# Patient Record
Sex: Male | Born: 1975 | Hispanic: Yes | Marital: Married | Smoking: Never smoker
Health system: Southern US, Community
[De-identification: ages and names within clinical notes are randomized; demographics above are authoritative.]

## PROBLEM LIST (undated history)

## (undated) DIAGNOSIS — K8020 Calculus of gallbladder without cholecystitis without obstruction: Secondary | ICD-10-CM

## (undated) HISTORY — DX: Calculus of gallbladder without cholecystitis without obstruction: K80.20

## (undated) HISTORY — PX: CHOLECYSTECTOMY: SHX55

---

## 2010-05-03 ENCOUNTER — Emergency Department (HOSPITAL_COMMUNITY)
Admission: EM | Admit: 2010-05-03 | Discharge: 2010-05-04 | Disposition: A | Discharge status: Home / Self Care | Payer: 59 | Attending: Emergency Medicine | Admitting: Emergency Medicine

## 2010-05-03 DIAGNOSIS — R197 Diarrhea, unspecified: Secondary | ICD-10-CM | POA: Insufficient documentation

## 2010-05-04 LAB — CBC
HCT: 42.8 % (ref 39.0–52.0)
RBC: 4.96 MIL/uL (ref 4.22–5.81)
RDW: 13.5 % (ref 11.5–15.5)
WBC: 7.9 10*3/uL (ref 4.0–10.5)

## 2010-05-04 LAB — BASIC METABOLIC PANEL
Chloride: 105 mEq/L (ref 96–112)
GFR calc non Af Amer: >60 mL/min (ref >60)
Glucose, Bld: 106 mg/dL — ABNORMAL HIGH (ref 70–99)
Potassium: 4.1 mEq/L (ref 3.5–5.1)
Sodium: 142 mEq/L (ref 135–145)

## 2010-05-04 LAB — DIFFERENTIAL
Basophils Absolute: 0 10*3/uL (ref 0.0–0.1)
Eosinophils Relative: 2 % (ref 0–5)
Lymphocytes Relative: 20 % (ref 12–46)
Lymphs Abs: 1.5 10*3/uL (ref 0.7–4.0)
Neutro Abs: 5.6 10*3/uL (ref 1.7–7.7)
Neutrophils Relative %: 71 % (ref 43–77)

## 2010-05-04 LAB — OCCULT BLOOD, POC DEVICE: Fecal Occult Bld: POSITIVE

## 2018-09-25 ENCOUNTER — Other Ambulatory Visit: Payer: Self-pay

## 2018-09-25 DIAGNOSIS — Z20822 Contact with and (suspected) exposure to covid-19: Secondary | ICD-10-CM

## 2018-09-27 LAB — NOVEL CORONAVIRUS, NAA: SARS-CoV-2, NAA: NOT DETECTED

## 2018-09-29 ENCOUNTER — Other Ambulatory Visit: Payer: Self-pay

## 2018-09-29 DIAGNOSIS — Z20822 Contact with and (suspected) exposure to covid-19: Secondary | ICD-10-CM

## 2018-09-30 LAB — NOVEL CORONAVIRUS, NAA: SARS-CoV-2, NAA: NOT DETECTED

## 2020-04-07 ENCOUNTER — Telehealth: Payer: Self-pay | Admitting: Family

## 2020-04-07 DIAGNOSIS — R6889 Other general symptoms and signs: Secondary | ICD-10-CM

## 2020-04-07 DIAGNOSIS — Z20828 Contact with and (suspected) exposure to other viral communicable diseases: Secondary | ICD-10-CM

## 2020-04-07 MED ORDER — OSELTAMIVIR PHOSPHATE 75 MG PO CAPS: 75.0000 mg | ORAL_CAPSULE | Freq: Two times a day (BID) | ORAL | 0 refills | Status: DC

## 2020-04-07 NOTE — Progress Notes (Signed)
E visit for Flu like symptoms   We are sorry that you are not feeling well.  Here is how we plan to help! Based on what you have shared with me it looks like you may have possible exposure to a virus that causes influenza.  Influenza or "the flu" is   an infection caused by a respiratory virus. The flu virus is highly contagious and persons who did not receive their yearly flu vaccination may "catch" the flu from close contact.  We have anti-viral medications to treat the viruses that cause this infection. They are not a "cure" and only shorten the course of the infection. These prescriptions are most effective when they are given within the first 2 days of "flu" symptoms. Antiviral medication are indicated if you have a high risk of complications from the flu. You should  also consider an antiviral medication if you are in close contact with someone who is at risk. These medications can help patients avoid complications from the flu  but have side effects that you should know. Possible side effects from Tamiflu or oseltamivir include nausea, vomiting, diarrhea, dizziness, headaches, eye redness, sleep problems or other respiratory symptoms. You should not take Tamiflu if you have an allergy to oseltamivir or any to the ingredients in Tamiflu.  Based upon your symptoms and potential risk factors I have prescribed Oseltamivir (Tamiflu).  It has been sent to your designated pharmacy.  You will take one 75 mg capsule orally twice a day for the next 5 days.  ANYONE WHO HAS FLU SYMPTOMS SHOULD: . Stay home. The flu is highly contagious and going out or to work exposes others! . Be sure to drink plenty of fluids. Water is fine as well as fruit juices, sodas and electrolyte beverages. You may want to stay away from caffeine or alcohol. If you are nauseated, try taking small sips of liquids. How do you know if you are getting enough fluid? Your urine should be a pale yellow or almost colorless. . Get  rest. . Taking a steamy shower or using a humidifier may help nasal congestion and ease sore throat pain. Using a saline nasal spray works much the same way. . Cough drops, hard candies and sore throat lozenges may ease your cough. . Line up a caregiver. Have someone check on you regularly.   GET HELP RIGHT AWAY IF: . You cannot keep down liquids or your medications. . You become short of breath . Your fell like you are going to pass out or loose consciousness. . Your symptoms persist after you have completed your treatment plan MAKE SURE YOU   Understand these instructions.  Will watch your condition.  Will get help right away if you are not doing well or get worse.  Your e-visit answers were reviewed by a board certified advanced clinical practitioner to complete your personal care plan.  Depending on the condition, your plan could have included both over the counter or prescription medications.  If there is a problem please reply  once you have received a response from your provider.  Your safety is important to us.  If you have drug allergies check your prescription carefully.    You can use MyChart to ask questions about today's visit, request a non-urgent call back, or ask for a work or school excuse for 24 hours related to this e-Visit. If it has been greater than 24 hours you will need to follow up with your provider, or enter a new   e-Visit to address those concerns.  You will get an e-mail in the next two days asking about your experience.  I hope that your e-visit has been valuable and will speed your recovery. Thank you for using e-visits.  Approximately 5 minutes was spent documenting and reviewing patient's chart.

## 2020-11-14 ENCOUNTER — Other Ambulatory Visit: Payer: Self-pay

## 2020-11-14 ENCOUNTER — Ambulatory Visit: Payer: 59 | Admitting: Nurse Practitioner

## 2020-11-14 ENCOUNTER — Encounter: Payer: Self-pay | Admitting: Nurse Practitioner

## 2020-11-14 VITALS — BP 126/84 | HR 83 | Temp 98.3°F | Ht 72.0 in | Wt 311.0 lb

## 2020-11-14 DIAGNOSIS — Z7689 Persons encountering health services in other specified circumstances: Secondary | ICD-10-CM

## 2020-11-14 DIAGNOSIS — R1011 Right upper quadrant pain: Secondary | ICD-10-CM | POA: Insufficient documentation

## 2020-11-14 DIAGNOSIS — Z23 Encounter for immunization: Secondary | ICD-10-CM | POA: Diagnosis not present

## 2020-11-14 DIAGNOSIS — Z0000 Encounter for general adult medical examination without abnormal findings: Secondary | ICD-10-CM

## 2020-11-14 DIAGNOSIS — Z6841 Body Mass Index (BMI) 40.0 and over, adult: Secondary | ICD-10-CM

## 2020-11-14 NOTE — Progress Notes (Signed)
New Patient Office Visit  Subjective:  Patient ID: Benjamin Mann, male    DOB: 11/06/1975  Age: 45 y.o. MRN: [id number]  CC:  Chief Complaint  Patient presents with   New Patient (Initial Visit)    HPI Benjamin Mann presents to establish new primary care provider. States that in June, he had episode of right upper quadrant abdominal pain. This was accompanied by nausea and vomiting. vomitus was yellow bile like fluid. He did not have a fever. Thinks he ate wings and pizza prior to episode, but is not certain. He states that episode lasted for a few hours then gradually subsided on it's own. He had similar episode in January 2022. He was not seen in ER as symptoms did resolve spontaneously.   He is due to have check of routine, fasting labs.  Would like to get flu shot today. Needs to have annual wellness visit .  History reviewed. No pertinent past medical history.  History reviewed. No pertinent surgical history.  Family History  Problem Relation Age of Onset   Heart attack Mother    Depression Mother    High Cholesterol Mother    High blood pressure Mother    High blood pressure Father    Diabetes Father    Stomach cancer Maternal Grandfather     Social History   Socioeconomic History   Marital status: Married    Spouse name: Not on file   Number of children: Not on file   Years of education: Not on file   Highest education level: Not on file  Occupational History   Not on file  Tobacco Use   Smoking status: Never   Smokeless tobacco: Never  Vaping Use   Vaping Use: Not on file  Substance and Sexual Activity   Alcohol use: Never   Drug use: Never   Sexual activity: Yes  Other Topics Concern   Not on file  Social History Narrative   Not on file   Social Determinants of Health   Financial Resource Strain: Not on file  Food Insecurity: Not on file  Transportation Needs: Not on file  Physical Activity: Not on file  Stress: Not on file  Social  Connections: Not on file  Intimate Partner Violence: Not on file    ROS Review of Systems  Constitutional:  Negative for activity change, chills, fatigue and fever.  HENT:  Negative for congestion, postnasal drip, rhinorrhea, sinus pressure, sinus pain, sneezing and sore throat.   Eyes: Negative.   Respiratory:  Negative for cough, shortness of breath and wheezing.   Cardiovascular:  Negative for chest pain and palpitations.  Gastrointestinal:  Negative for constipation, diarrhea, nausea and vomiting.       Two episodes of sudden and sharp right upper quadrant abdominal pain since the beginning of 2022. Both episodes included nausea and vomiting bile. Both episodes resolved on their own.   Endocrine: Negative for cold intolerance, heat intolerance, polydipsia and polyuria.  Genitourinary:  Negative for dysuria, frequency and urgency.  Musculoskeletal:  Negative for back pain and myalgias.  Skin:  Negative for rash.  Allergic/Immunologic: Negative for environmental allergies.  Neurological:  Negative for dizziness, weakness and headaches.  Psychiatric/Behavioral:  The patient is not nervous/anxious.    Objective:   Today's Vitals   11/14/20 0954  BP: 126/84  Pulse: 83  Temp: 98.3 F (36.8 C)  SpO2: 96%  Weight: (!) 311 lb (141.1 kg)  Height: 6' (1.829 m)   Body mass index is  42.18 kg/m.   Physical Exam Vitals and nursing note reviewed.  Constitutional:      Appearance: Normal appearance. He is well-developed. He is obese.  HENT:     Head: Normocephalic and atraumatic.  Eyes:     Pupils: Pupils are equal, round, and reactive to light.  Cardiovascular:     Rate and Rhythm: Normal rate and regular rhythm.     Pulses: Normal pulses.     Heart sounds: Normal heart sounds.  Pulmonary:     Effort: Pulmonary effort is normal.     Breath sounds: Normal breath sounds.  Abdominal:     General: Bowel sounds are normal.     Palpations: Abdomen is soft.     Tenderness: There is  no abdominal tenderness.  Musculoskeletal:        General: Normal range of motion.     Cervical back: Normal range of motion and neck supple.  Lymphadenopathy:     Cervical: No cervical adenopathy.  Skin:    General: Skin is warm and dry.     Capillary Refill: Capillary refill takes less than 2 seconds.  Neurological:     General: No focal deficit present.     Mental Status: He is alert and oriented to person, place, and time.  Psychiatric:        Mood and Affect: Mood normal.        Behavior: Behavior normal.        Thought Content: Thought content normal.        Judgment: Judgment normal.    Assessment & Plan:  1. Encounter to establish care Appointment today to establish new primary care provider    2. Right upper quadrant abdominal pain Concern for gallstones or cholecystitis causing episodes of right upper quadrant abdominal pain with vomiting. Will get ultrasound of the RUQ of abdomen for further evaluation. Refer as indicated.  - US Abdomen Limited RUQ (LIVER/GB); Future  3. Body mass index (BMI) of 40.1-44.9 in adult [facility]) Encourage patient to limit calorie intake to 2000 cal/day or less.  He should consume a low cholesterol, low-fat diet.  Patient incorporate exercise into his daily routine.   4. Healthcare maintenance Routine, fasting labs drawn during today's visit  - Hemoglobin A1c; Future - CBC; Future - Comp Met (CMET); Future - TSH; Future - Lipid panel; Future - TSH - Comp Met (CMET) - CBC - Hemoglobin A1c - Lipid panel  5. Need for influenza vaccination Flu vaccie administered during today's visit  - Flu Vaccine QUAD 6+ mos PF IM (Fluarix Quad PF)    Problem List Items Addressed This Visit       Other   Right upper quadrant abdominal pain   Relevant Orders   US Abdomen Limited RUQ (LIVER/GB)   Body mass index (BMI) of 40.1-44.9 in adult [facility])   Other Visit Diagnoses     Encounter to establish care    -  Primary   Healthcare maintenance        Relevant Orders   Hemoglobin A1c   CBC   Comp Met (CMET)   TSH   Lipid panel   Need for influenza vaccination       Relevant Orders   Flu Vaccine QUAD 6+ mos PF IM (Fluarix Quad PF) (Completed)       Outpatient Encounter Medications as of 11/14/2020  Medication Sig   oseltamivir (TAMIFLU) 75 MG capsule Take 1 capsule (75 mg total) by mouth 2 (two) times daily.  No facility-administered encounter medications on file as of 11/14/2020.    Follow-up: Return in about 6 weeks (around 12/26/2020) for health maintenance exam.   Ronnell Freshwater, NP

## 2020-11-14 NOTE — Patient Instructions (Signed)
Fat and Cholesterol Restricted Eating Plan Getting too much fat and cholesterol in your diet may cause health problems. Choosing the right foods helps keep your fat and cholesterol at normal levels. This can keep you from getting certain diseases. Your doctor may recommend an eating plan that includes: Total fat: ______% or less of total calories a day. This is ______g of fat a day. Saturated fat: ______% or less of total calories a day. This is ______g of saturated fat a day. Cholesterol: less than _________mg a day. Fiber: ______g a day. What are tips for following this plan? General tips Work with your doctor to lose weight if you need to. Avoid: Foods with added sugar. Fried foods. Foods with trans fat or partially hydrogenated oils. This includes some margarines and baked goods. If you drink alcohol: Limit how much you have to: 0-1 drink a day for women who are not pregnant. 0-2 drinks a day for men. Know how much alcohol is in a drink. In the [place] one drink equals one 12 oz bottle of beer (355 mL), one 5 oz glass of wine (148 mL), or one 1 oz glass of hard liquor (44 mL). Reading food labels Check food labels for: Trans fats. Partially hydrogenated oils. Saturated fat (g) in each serving. Cholesterol (mg) in each serving. Fiber (g) in each serving. Choose foods with healthy fats, such as: Monounsaturated fats and polyunsaturated fats. These include olive and canola oil, flaxseeds, walnuts, almonds, and seeds. Omega-3 fats. These are found in certain fish, flaxseed oil, and ground flaxseeds. Choose grain products that have whole grains. Look for the word "whole" as the first word in the ingredient list. Cooking Cook foods using low-fat methods. These include baking, boiling, grilling, and broiling. Eat more home-cooked foods. Eat at restaurants and buffets less often. Eat less fast food. Avoid cooking using saturated fats, such as butter, cream, palm oil, palm kernel oil, and  coconut oil. Meal planning  At meals, divide your plate into four equal parts: Fill one-half of your plate with vegetables, green salads, and fruit. Fill one-fourth of your plate with whole grains. Fill one-fourth of your plate with low-fat (lean) protein foods. Eat fish that is high in omega-3 fats at least two times a week. This includes mackerel, tuna, sardines, and salmon. Eat foods that are high in fiber, such as whole grains, beans, apples, pears, berries, broccoli, carrots, peas, and barley. What foods should I eat? Fruits All fresh, canned (in natural juice), or frozen fruits. Vegetables Fresh or frozen vegetables (raw, steamed, roasted, or grilled). Green salads. Grains Whole grains, such as whole wheat or whole grain breads, crackers, cereals, and pasta. Unsweetened oatmeal, bulgur, barley, quinoa, or brown rice. Corn or whole wheat flour tortillas. Meats and other protein foods Ground beef (85% or leaner), grass-fed beef, or beef trimmed of fat. Skinless chicken or turkey. Ground chicken or turkey. Pork trimmed of fat. All fish and seafood. Egg whites. Dried beans, peas, or lentils. Unsalted nuts or seeds. Unsalted canned beans. Nut butters without added sugar or oil. Dairy Low-fat or nonfat dairy products, such as skim or 1% milk, 2% or reduced-fat cheeses, low-fat and fat-free ricotta or cottage cheese, or plain low-fat and nonfat yogurt. Fats and oils Tub margarine without trans fats. Light or reduced-fat mayonnaise and salad dressings. Avocado. Olive, canola, sesame, or safflower oils. The items listed above may not be a complete list of foods and beverages you can eat. Contact a dietitian for more information. What foods   should I avoid? Fruits Canned fruit in heavy syrup. Fruit in cream or butter sauce. Fried fruit. Vegetables Vegetables cooked in cheese, cream, or butter sauce. Fried vegetables. Grains White bread. White pasta. White rice. Cornbread. Bagels, pastries,  and croissants. Crackers and snack foods that contain trans fat and hydrogenated oils. Meats and other protein foods Fatty cuts of meat. Ribs, chicken wings, bacon, sausage, bologna, salami, chitterlings, fatback, hot dogs, bratwurst, and packaged lunch meats. Liver and organ meats. Whole eggs and egg yolks. Chicken and turkey with skin. Fried meat. Dairy Whole or 2% milk, cream, half-and-half, and cream cheese. Whole milk cheeses. Whole-fat or sweetened yogurt. Full-fat cheeses. Nondairy creamers and whipped toppings. Processed cheese, cheese spreads, and cheese curds. Fats and oils Butter, stick margarine, lard, shortening, ghee, or bacon fat. Coconut, palm kernel, and palm oils. Beverages Alcohol. Sugar-sweetened drinks such as sodas, lemonade, and fruit drinks. Sweets and desserts Corn syrup, sugars, honey, and molasses. Candy. Jam and jelly. Syrup. Sweetened cereals. Cookies, pies, cakes, donuts, muffins, and ice cream. The items listed above may not be a complete list of foods and beverages you should avoid. Contact a dietitian for more information. Summary Choosing the right foods helps keep your fat and cholesterol at normal levels. This can keep you from getting certain diseases. At meals, fill one-half of your plate with vegetables, green salads, and fruits. Eat high fiber foods, like whole grains, beans, apples, pears, berries, carrots, peas, and barley. Limit added sugar, saturated fats, alcohol, and fried foods. This information is not intended to replace advice given to you by your health care provider. Make sure you discuss any questions you have with your health care provider. Document Revised: 05/02/2020 Document Reviewed: 05/02/2020 Elsevier Patient Education  2022 Elsevier Inc.  

## 2020-11-15 LAB — COMPREHENSIVE METABOLIC PANEL
ALT: 18 IU/L (ref 0–44)
AST: 15 IU/L (ref 0–40)
Albumin/Globulin Ratio: 2 (ref 1.2–2.2)
Albumin: 4.7 g/dL (ref 4.0–5.0)
Alkaline Phosphatase: 94 IU/L (ref 44–121)
BUN/Creatinine Ratio: 15 (ref 9–20)
BUN: 13 mg/dL (ref 6–24)
Bilirubin Total: 0.6 mg/dL (ref 0.0–1.2)
CO2: 24 mmol/L (ref 20–29)
Calcium: 9.5 mg/dL (ref 8.7–10.2)
Chloride: 103 mmol/L (ref 96–106)
Creatinine, Ser: 0.86 mg/dL (ref 0.76–1.27)
Globulin, Total: 2.3 g/dL (ref 1.5–4.5)
Glucose: 88 mg/dL (ref 70–99)
Potassium: 4.3 mmol/L (ref 3.5–5.2)
Sodium: 141 mmol/L (ref 134–144)
Total Protein: 7 g/dL (ref 6.0–8.5)
eGFR: 110 mL/min/{1.73_m2} (ref >59)

## 2020-11-15 LAB — CBC
Hematocrit: 43.2 % (ref 37.5–51.0)
Hemoglobin: 14.2 g/dL (ref 13.0–17.7)
MCH: 28.7 pg (ref 26.6–33.0)
MCHC: 32.9 g/dL (ref 31.5–35.7)
MCV: 87 fL (ref 79–97)
Platelets: 276 10*3/uL (ref 150–450)
RBC: 4.95 x10E6/uL (ref 4.14–5.80)
RDW: 13.8 % (ref 11.6–15.4)
WBC: 6.8 10*3/uL (ref 3.4–10.8)

## 2020-11-15 LAB — LIPID PANEL
Chol/HDL Ratio: 4.8 ratio (ref 0.0–5.0)
Cholesterol, Total: 211 mg/dL — ABNORMAL HIGH (ref 100–199)
HDL: 44 mg/dL (ref >39)
LDL Chol Calc (NIH): 148 mg/dL — ABNORMAL HIGH (ref 0–99)
Triglycerides: 104 mg/dL (ref 0–149)
VLDL Cholesterol Cal: 19 mg/dL (ref 5–40)

## 2020-11-15 LAB — HEMOGLOBIN A1C
Est. average glucose Bld gHb Est-mCnc: 114 mg/dL
Hgb A1c MFr Bld: 5.6 % (ref 4.8–5.6)

## 2020-11-15 LAB — TSH: TSH: 2.7 u[IU]/mL (ref 0.450–4.500)

## 2020-11-26 NOTE — Progress Notes (Signed)
LDL and total cholesterol mildly elevated. Other labs good. Waiting on abdominal ultrasound results.

## 2020-12-05 ENCOUNTER — Other Ambulatory Visit: Payer: 59

## 2021-01-09 ENCOUNTER — Ambulatory Visit
Admission: RE | Admit: 2021-01-09 | Discharge: 2021-01-09 | Disposition: A | Discharge status: Home / Self Care | Payer: 59 | Source: Ambulatory Visit | Attending: Nurse Practitioner | Admitting: Nurse Practitioner

## 2021-01-09 ENCOUNTER — Other Ambulatory Visit: Payer: Self-pay

## 2021-01-09 DIAGNOSIS — R1011 Right upper quadrant pain: Secondary | ICD-10-CM

## 2021-01-12 ENCOUNTER — Other Ambulatory Visit: Payer: Self-pay | Admitting: Nurse Practitioner

## 2021-01-12 ENCOUNTER — Telehealth: Payer: Self-pay | Admitting: Nurse Practitioner

## 2021-01-12 DIAGNOSIS — R1011 Right upper quadrant pain: Secondary | ICD-10-CM

## 2021-01-12 DIAGNOSIS — D376 Neoplasm of uncertain behavior of liver, gallbladder and bile ducts: Secondary | ICD-10-CM

## 2021-01-12 DIAGNOSIS — K8020 Calculus of gallbladder without cholecystitis without obstruction: Secondary | ICD-10-CM

## 2021-01-12 NOTE — Progress Notes (Signed)
Urgent referral to GI made. 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 Has intermittent, but severe right upper quadrant pain.

## 2021-01-12 NOTE — Telephone Encounter (Signed)
With this being a new diagnosis of a hypoechoic mass, it would be more appropriate for you as the provider to call to discuss this with him.

## 2021-01-12 NOTE — Telephone Encounter (Signed)
Yes. I have spoken to the patient and he is aware of the findings. He also knows that an urgent referral to GI has been made and that he will, most likely, see surgeon after that.

## 2021-01-12 NOTE — Telephone Encounter (Signed)
Please let the patient know that I have make and urgent referral to GI made. 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  Thanks so much.   -HB

## 2021-01-12 NOTE — Telephone Encounter (Signed)
[facility] called with results from [place] of Abdomen. Please review these results (they are in the chart). AS, CMA

## 2021-01-19 ENCOUNTER — Encounter: Payer: Self-pay | Admitting: Gastroenterology

## 2021-02-06 ENCOUNTER — Encounter: Payer: Self-pay | Admitting: Gastroenterology

## 2021-02-06 ENCOUNTER — Ambulatory Visit (INDEPENDENT_AMBULATORY_CARE_PROVIDER_SITE_OTHER): Payer: 59 | Admitting: Gastroenterology

## 2021-02-06 ENCOUNTER — Encounter: Payer: 59 | Admitting: Nurse Practitioner

## 2021-02-06 VITALS — BP 150/98 | HR 84 | Ht 72.75 in | Wt 326.0 lb

## 2021-02-06 DIAGNOSIS — K760 Fatty (change of) liver, not elsewhere classified: Secondary | ICD-10-CM

## 2021-02-06 DIAGNOSIS — K8020 Calculus of gallbladder without cholecystitis without obstruction: Secondary | ICD-10-CM

## 2021-02-06 DIAGNOSIS — R932 Abnormal findings on diagnostic imaging of liver and biliary tract: Secondary | ICD-10-CM

## 2021-02-06 NOTE — Patient Instructions (Signed)
If you are age 45 or older, your body mass index should be between 23-30. Your Body mass index is 43.31 kg/m. If this is out of the aforementioned range listed, please consider follow up with your Primary Care Provider.  If you are age 21 or younger, your body mass index should be between 19-25. Your Body mass index is 43.31 kg/m. If this is out of the aformentioned range listed, please consider follow up with your Primary Care Provider.   You will be contacted by [place] in the next 2 days to arrange a .........  The number on your caller ID will be [phone], please answer when they call.  If you have not heard from them in 2 days please call [phone] to schedule.     You have been scheduled for an MR a.......... on.......Marland KitchenYour appointment time is......... Please arrive to admitting (at main entrance of the hospital) 15 minutes prior to your appointment time for registration purposes. Please make certain not to have anything to eat or drink 6 hours prior to your test. In addition, if you have any metal in your body, have a pacemaker or defibrillator, please be sure to let your ordering physician know. This test typically takes 45 minutes to 1 hour to complete. Should you need to reschedule, please call [phone] to do so.   We will send a referral to [facility] and they will call you to schedule an appointment.    The [facility] providers would like to encourage you to use [facility] to communicate with providers for non-urgent requests or questions.  Due to long hold times on the telephone, sending your provider a message by [facility] may be a faster and more efficient way to get a response.  Please allow 48 business hours for a response.  Please remember that this is for non-urgent requests.   It was a pleasure to see you today!  Thank you for trusting me with your gastrointestinal care!    Scott E.Candis Schatz, MD

## 2021-02-06 NOTE — Progress Notes (Signed)
HPI : Benjamin Mann is a very pleasant 46 year old male with obesity, but no other chronic medical history.  He is referred to [place] by [name] NP for further evaluation of incidentally noted liver lesion on right upper quadrant ultrasound.  The ultrasound was performed to evaluate episodic right upper quadrant pain.  He reports having 3 episodes of this pain total, the last one in September 2022.  He describes the episode as awakening from sleep at 2 AM with severe pain in the right upper quadrant.  The pain was 10 out of 10 in severity and was constant, lasting for hours.  He eventually vomited a large amount of yellowish bilious fluid, and this helped relieve his pain.  He had no fevers or chills.  The next day, he had some mild lingering pain in the right upper abdomen, but otherwise felt okay.  He has not had recurrence of this pain since then.   He denies frequent symptoms of right upper quadrant pain or nausea.  He does have occasional symptoms of heartburn described as a burning sensation in his upper abdomen or chest, which is improved with taking Tums.  He has regular bowel movements, without problems of constipation, diarrhea or blood in the stool. He underwent an ultrasound in January of this year which showed gallstones with no evidence of cholecystitis, but was also noted to have a 3 cm solid lesion in the right hepatic lobe.  Diffusely increased echogenicity of the liver parenchyma was noted.  The patient has no history of liver disease.  He has no family history of liver disease or liver cancer.  No family history of gallstones. He is obese, but has no other NAFLD risk factors.  He rarely drinks any alcohol.  He denies any history of regular alcohol use.   History reviewed. No pertinent past medical history.   History reviewed. No pertinent surgical history. Family History  Problem Relation Age of Onset   Heart attack Mother    Depression Mother    High Cholesterol Mother     High blood pressure Mother    High blood pressure Father    Diabetes Father    Stomach cancer Maternal Grandfather    Social History   Tobacco Use   Smoking status: Never   Smokeless tobacco: Never  Substance Use Topics   Alcohol use: Never   Drug use: Never   No current outpatient medications on file.   No current facility-administered medications for this visit.   No Known Allergies   Review of Systems: All systems reviewed and negative except where noted in HPI.    US Abdomen Limited RUQ (LIVER/GB)  Result Date: 01/09/2021 CLINICAL DATA:  Several episodes of right upper quadrant pain with bilious vomiting in the past year. EXAM: ULTRASOUND ABDOMEN LIMITED RIGHT UPPER QUADRANT COMPARISON:  None. FINDINGS: Gallbladder: Cholelithiasis. No gallbladder wall thickening or pericholecystic fluid. Sonographic Percell Miller sign is negative per technologist. Common bile duct: Diameter: 3 mm Liver: Diffuse increased echogenicity. 3.0 cm solid hypoechoic mass noted in the right liver lobe. Additional hypoechoic lesion in the posterior right hepatic lobe measuring 2.1 cm is consistent with a cyst. Portal vein is patent on color Doppler imaging with normal direction of blood flow towards the liver. Other: None. IMPRESSION: 1. 3.0 cm solid hypoechoic right liver mass or course further evaluation with contrast enhanced CT or MRI. 2. Mild cholelithiasis These results will be called to the ordering clinician or representative by the Radiologist Assistant, and communication documented  in the PACS or [employer]. Electronically Signed   By: Miachel Roux M.D.   On: 01/09/2021 13:27    Physical Exam: BP (!) 150/98    Pulse 84    Ht 6' 0.75" (1.848 m)    Wt (!) 326 lb (147.9 kg)    BMI 43.31 kg/m  Constitutional: Pleasant,well-developed, obese Caucasian male in no acute distress. HEENT: Normocephalic and atraumatic. Conjunctivae are normal. No scleral icterus.  Mallampati 3 Cardiovascular: Normal rate,  regular rhythm.  Pulmonary/chest: Effort normal and breath sounds normal. No wheezing, rales or rhonchi. Abdominal: Soft, nondistended, nontender.  Murphy's negative.  Bowel sounds active throughout. There are no masses palpable. No hepatomegaly. Extremities: no edema Neurological: Alert and oriented to person place and time. Skin: Skin is warm and dry. No rashes noted. Psychiatric: Normal mood and affect. Behavior is normal.  CBC    Component Value Date/Time   WBC 6.8 11/14/2020 1038   WBC 7.9 05/04/2010 0217   RBC 4.95 11/14/2020 1038   RBC 4.96 05/04/2010 0217   HGB 14.2 11/14/2020 1038   HCT 43.2 11/14/2020 1038   PLT 276 11/14/2020 1038   MCV 87 11/14/2020 1038   MCH 28.7 11/14/2020 1038   MCH 29.0 05/04/2010 0217   MCHC 32.9 11/14/2020 1038   MCHC 33.6 05/04/2010 0217   RDW 13.8 11/14/2020 1038   LYMPHSABS 1.5 05/04/2010 0217   MONOABS 0.6 05/04/2010 0217   EOSABS 0.2 05/04/2010 0217   BASOSABS 0.0 05/04/2010 0217    CMP     Component Value Date/Time   NA 141 11/14/2020 1038   K 4.3 11/14/2020 1038   CL 103 11/14/2020 1038   CO2 24 11/14/2020 1038   GLUCOSE 88 11/14/2020 1038   GLUCOSE 106 (H) 05/04/2010 0217   BUN 13 11/14/2020 1038   CREATININE 0.86 11/14/2020 1038   CALCIUM 9.5 11/14/2020 1038   PROT 7.0 11/14/2020 1038   ALBUMIN 4.7 11/14/2020 1038   AST 15 11/14/2020 1038   ALT 18 11/14/2020 1038   ALKPHOS 94 11/14/2020 1038   BILITOT 0.6 11/14/2020 1038   GFRNONAA >60 05/04/2010 0217   GFRAA  05/04/2010 0217    >60        The eGFR has been calculated using the MDRD equation. This calculation has not been validated in all clinical situations. eGFR's persistently <60 mL/min signify possible Chronic Kidney Disease.     ASSESSMENT AND PLAN: 46 year old male with 3 isolated episodes of severe right upper quadrant pain associated with nausea and vomiting, last episode in September, with ultrasound evidence of cholelithiasis.  This history seems  consistent with symptomatic cholelithiasis.  Although his episodes are infrequent, I do recommend he be evaluated by a surgeon to discuss cholecystectomy.  He was incidentally found to have a 3 cm solid-appearing hypoechoic mass.  He has no history of cirrhosis and has no evidence of cirrhosis on imaging or labs.  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  The nature of this 3 cm mass is unclear, need to further characterize with MRI with contrast.    Liver mass - MRI liver w/ contrast  Symptomatic cholelithiasis -Refer to general surgery to discuss cholecystectomy  Fatty liver - We will discuss further after MRI  Colon cancer screening - Patient will be due for screening colonoscopy after he turns 46 in December of this year  Masahiro Iglesia E. Candis Schatz, MD [facility]   CC:  Ronnell Freshwater, NP

## 2021-02-20 ENCOUNTER — Encounter: Payer: 59 | Admitting: Nurse Practitioner

## 2021-02-20 ENCOUNTER — Ambulatory Visit (HOSPITAL_COMMUNITY)
Admission: RE | Admit: 2021-02-20 | Discharge: 2021-02-20 | Disposition: A | Discharge status: Home / Self Care | Payer: 59 | Source: Ambulatory Visit | Attending: Gastroenterology | Admitting: Gastroenterology

## 2021-02-20 ENCOUNTER — Other Ambulatory Visit: Payer: Self-pay

## 2021-02-20 DIAGNOSIS — K8020 Calculus of gallbladder without cholecystitis without obstruction: Secondary | ICD-10-CM | POA: Insufficient documentation

## 2021-02-20 DIAGNOSIS — R932 Abnormal findings on diagnostic imaging of liver and biliary tract: Secondary | ICD-10-CM | POA: Insufficient documentation

## 2021-02-20 MED ORDER — GADOBUTROL 1 MMOL/ML IV SOLN
10.0000 mL | Freq: Once | INTRAVENOUS | Status: AC | PRN
  Administered 2021-02-20: 10 mL via INTRAVENOUS

## 2021-02-23 ENCOUNTER — Encounter: Payer: Self-pay | Admitting: Gastroenterology

## 2021-02-24 NOTE — Progress Notes (Signed)
Benjamin Mann,  Your MRI showed that the large lesion in your liver is a hemangioma.  This is good news.  Hemangiomas in the liver are very common and are completely benign.  They do not have the potential to turn into cancer.  They do not cause symptoms.  You also had a few cysts in the liver which are also common and are completely benign.  You do not require any follow up imaging or surveillance of these findings. I'm not sure why you have not heard anything from the surgeon's office.   Vaughan Basta,  Are you able to check the status of his referral to General Surgery and see if there is anything we can do to help him get seen?

## 2021-02-25 ENCOUNTER — Telehealth: Payer: Self-pay | Admitting: Gastroenterology

## 2021-02-25 NOTE — Telephone Encounter (Signed)
Patient calling to follow up on [facility] referral said he called them to schedule but they told him there is no referral.

## 2021-02-25 NOTE — Telephone Encounter (Signed)
Called [facility] and they stated patient is scheduled for 03/03/21 . I called patient and confirmed that he knew when his appointment was. Patient confirmed and had no questions at the end of call.

## 2021-03-03 ENCOUNTER — Other Ambulatory Visit: Payer: Self-pay | Admitting: Surgery

## 2021-03-03 ENCOUNTER — Ambulatory Visit: Payer: Self-pay | Admitting: Surgery

## 2021-03-27 ENCOUNTER — Encounter: Payer: 59 | Admitting: Nurse Practitioner

## 2021-11-05 ENCOUNTER — Ambulatory Visit (INDEPENDENT_AMBULATORY_CARE_PROVIDER_SITE_OTHER): Payer: 59 | Admitting: Gastroenterology

## 2021-11-05 ENCOUNTER — Encounter: Payer: Self-pay | Admitting: Gastroenterology

## 2021-11-05 VITALS — BP 130/90 | HR 84 | Ht 69.69 in | Wt 331.4 lb

## 2021-11-05 DIAGNOSIS — Z1212 Encounter for screening for malignant neoplasm of rectum: Secondary | ICD-10-CM | POA: Diagnosis not present

## 2021-11-05 DIAGNOSIS — K219 Gastro-esophageal reflux disease without esophagitis: Secondary | ICD-10-CM | POA: Diagnosis not present

## 2021-11-05 DIAGNOSIS — Z1211 Encounter for screening for malignant neoplasm of colon: Secondary | ICD-10-CM

## 2021-11-05 MED ORDER — NA SULFATE-K SULFATE-MG SULF 17.5-3.13-1.6 GM/177ML PO SOLN: 1.0000 | Freq: Once | ORAL | 0 refills | Status: AC

## 2021-11-05 NOTE — Progress Notes (Signed)
HPI : Benjamin Mann is a very pleasant 46 year old male who I initially saw in February of this year with symptomatic cholelithiasis who is here today to discuss initial screening colonoscopy.  At his last visit, he was referred to general surgery and underwent a successful laparoscopic cholecystectomy by Dr. Johney Maine in May. The patient has done well since then.  He denies any problems such as diarrhea or bloating since having his gallbladder removed.  He is having regular bowel movements with no straining, constipation or hard stools.  No blood in the stool. He does have heartburn multiple times a week, and TUMS as needed which he says works very well for him.  He denies any nocturnal GERD symptoms.  No dysphagia.  No family history of esophageal cancer.  He does not smoke. He is turning 46 next month and would like to schedule his initial screening colonoscopy.  He has no family history of colon cancer.   Past Medical History:  Diagnosis Date   Gallstones    Past Surgical History:  Procedure Laterality Date   CHOLECYSTECTOMY     Family History  Problem Relation Age of Onset   Heart attack Mother    Depression Mother    High Cholesterol Mother    High blood pressure Mother    High blood pressure Father    Diabetes Father    Stomach cancer Maternal Grandfather    Social History   Tobacco Use   Smoking status: Never   Smokeless tobacco: Never  Vaping Use   Vaping Use: Never used  Substance Use Topics   Alcohol use: Never   Drug use: Never   No current outpatient medications on file.   No current facility-administered medications for this visit.   No Known Allergies   Review of Systems: All systems reviewed and negative except where noted in HPI.    No results found.  Physical Exam: BP (!) 130/90 (BP Location: Left Arm, Patient Position: Sitting, Cuff Size: Large)   Pulse 84   Ht 5' 9.69" (1.77 m) Comment: height measured without shoes  Wt (!) 331 lb 6 oz (150.3  kg)   BMI 47.98 kg/m  Constitutional: Pleasant,well-developed, Caucasian male in no acute distress. HEENT: Normocephalic and atraumatic. Conjunctivae are normal. No scleral icterus. Neck supple.  Cardiovascular: Normal rate, regular rhythm.  Pulmonary/chest: Effort normal and breath sounds normal. No wheezing, rales or rhonchi. Abdominal: Soft, nondistended, nontender. Bowel sounds active throughout. There are no masses palpable. No hepatomegaly. Extremities: no edema Neurological: Alert and oriented to person place and time. Skin: Skin is warm and dry. No rashes noted. Psychiatric: Normal mood and affect. Behavior is normal.  CBC    Component Value Date/Time   WBC 6.8 11/14/2020 1038   WBC 7.9 05/04/2010 0217   RBC 4.95 11/14/2020 1038   RBC 4.96 05/04/2010 0217   HGB 14.2 11/14/2020 1038   HCT 43.2 11/14/2020 1038   PLT 276 11/14/2020 1038   MCV 87 11/14/2020 1038   MCH 28.7 11/14/2020 1038   MCH 29.0 05/04/2010 0217   MCHC 32.9 11/14/2020 1038   MCHC 33.6 05/04/2010 0217   RDW 13.8 11/14/2020 1038   LYMPHSABS 1.5 05/04/2010 0217   MONOABS 0.6 05/04/2010 0217   EOSABS 0.2 05/04/2010 0217   BASOSABS 0.0 05/04/2010 0217    CMP     Component Value Date/Time   NA 141 11/14/2020 1038   K 4.3 11/14/2020 1038   CL 103 11/14/2020 1038   CO2  24 11/14/2020 1038   GLUCOSE 88 11/14/2020 1038   GLUCOSE 106 (H) 05/04/2010 0217   BUN 13 11/14/2020 1038   CREATININE 0.86 11/14/2020 1038   CALCIUM 9.5 11/14/2020 1038   PROT 7.0 11/14/2020 1038   ALBUMIN 4.7 11/14/2020 1038   AST 15 11/14/2020 1038   ALT 18 11/14/2020 1038   ALKPHOS 94 11/14/2020 1038   BILITOT 0.6 11/14/2020 1038   GFRNONAA >60 05/04/2010 0217   GFRAA  05/04/2010 0217    >60        The eGFR has been calculated using the MDRD equation. This calculation has not been validated in all clinical situations. eGFR's persistently <60 mL/min signify possible Chronic Kidney Disease.     ASSESSMENT AND  PLAN: 46 year old male will be due for initial average risk screening colonoscopy next month.  He has no chronic lower GI symptoms and no family history of colon cancer.  Will schedule for colonoscopy. He has frequent typical GERD symptoms of heartburn which he says responds very well to [facility].  He is happy with his symptom control on this regimen.  No nocturnal symptoms.  No dysphagia, family history of esophageal cancer or tobacco history.  No indication for Barrett's screening at this time, although this should be reconsidered after age 69.  Given his satisfactory symptom control with antacids alone, will hold off on PPI.  CRC screening - Colonoscopy  GERD - Continue PRN antacids  The details, risks (including bleeding, perforation, infection, missed lesions, medication reactions and possible hospitalization or surgery if complications occur), benefits, and alternatives to colonoscopy with possible biopsy and possible polypectomy were discussed with the patient and he consents to proceed.   Edrei Norgaard E. Candis Schatz, MD [facility]   CC:  Ronnell Freshwater, NP

## 2021-11-05 NOTE — Patient Instructions (Addendum)
_______________________________________________________  If you are age 45 or older, your body mass index should be between 23-30. Your Body mass index is 47.98 kg/m. If this is out of the aforementioned range listed, please consider follow up with your Primary Care Provider.  If you are age 69 or younger, your body mass index should be between 19-25. Your Body mass index is 47.98 kg/m. If this is out of the aformentioned range listed, please consider follow up with your Primary Care Provider.   You have been scheduled for a colonoscopy. Please follow written instructions given to you at your visit today.  Please pick up your prep supplies at the pharmacy within the next 1-3 days. If you use inhalers (even only as needed), please bring them with you on the day of your procedure.  Continue TUMS as needed.  The [facility] providers would like to encourage you to use [facility] to communicate with providers for non-urgent requests or questions.  Due to long hold times on the telephone, sending your provider a message by [facility] may be a faster and more efficient way to get a response.  Please allow 48 business hours for a response.  Please remember that this is for non-urgent requests.   It was a pleasure to see you today!  Thank you for trusting me with your gastrointestinal care!    Scott E.Candis Schatz, MD

## 2021-12-15 ENCOUNTER — Encounter: Payer: Self-pay | Admitting: Gastroenterology

## 2021-12-18 ENCOUNTER — Encounter: Payer: 59 | Admitting: Gastroenterology

## 2021-12-22 ENCOUNTER — Encounter: Payer: Self-pay | Admitting: Gastroenterology

## 2021-12-22 ENCOUNTER — Ambulatory Visit (AMBULATORY_SURGERY_CENTER): Payer: 59 | Admitting: Gastroenterology

## 2021-12-22 VITALS — BP 123/84 | HR 75 | Temp 98.9°F | Resp 20 | Ht 69.0 in | Wt 331.0 lb

## 2021-12-22 DIAGNOSIS — Z1211 Encounter for screening for malignant neoplasm of colon: Secondary | ICD-10-CM | POA: Diagnosis present

## 2021-12-22 DIAGNOSIS — K635 Polyp of colon: Secondary | ICD-10-CM | POA: Diagnosis not present

## 2021-12-22 DIAGNOSIS — Z1212 Encounter for screening for malignant neoplasm of rectum: Secondary | ICD-10-CM

## 2021-12-22 DIAGNOSIS — D123 Benign neoplasm of transverse colon: Secondary | ICD-10-CM

## 2021-12-22 MED ORDER — SODIUM CHLORIDE 0.9 % IV SOLN: 500.0000 mL | Freq: Once | INTRAVENOUS | Status: DC

## 2021-12-22 NOTE — Progress Notes (Signed)
Report to pacu rn. Vss. Care resumed by rn. 

## 2021-12-22 NOTE — Progress Notes (Signed)
[facility] History and Physical   Primary Care Physician:  Ronnell Freshwater, NP   Reason for Procedure:   Colon cancer screening  Plan:    Screening colonoscopy     HPI: Benjamin Mann is a 46 y.o. male undergoing initial average risk screening colonoscopy.  He has no family history of colon cancer and no chronic lower GI symptoms.    Past Medical History:  Diagnosis Date   Gallstones     Past Surgical History:  Procedure Laterality Date   CHOLECYSTECTOMY      Prior to Admission medications   Not on File    No current outpatient medications on file.   Current Facility-Administered Medications  Medication Dose Route Frequency Provider Last Rate Last Admin   0.9 %  sodium chloride infusion  500 mL Intravenous Once Daryel November, MD        Allergies as of 12/22/2021   (No Known Allergies)    Family History  Problem Relation Age of Onset   Heart attack Mother    Depression Mother    High Cholesterol Mother    High blood pressure Mother    High blood pressure Father    Diabetes Father    Stomach cancer Maternal Grandfather     Social History   Socioeconomic History   Marital status: Married    Spouse name: Not on file   Number of children: 2   Years of education: Not on file   Highest education level: Not on file  Occupational History   Occupation: Engineer, building services  Tobacco Use   Smoking status: Never   Smokeless tobacco: Never  Vaping Use   Vaping Use: Never used  Substance and Sexual Activity   Alcohol use: Never   Drug use: Never   Sexual activity: Yes  Other Topics Concern   Not on file  Social History Narrative   Not on file   Social Determinants of Health   Financial Resource Strain: Not on file  Food Insecurity: Not on file  Transportation Needs: Not on file  Physical Activity: Not on file  Stress: Not on file  Social Connections: Not on file  Intimate Partner Violence: Not on file    Review of Systems:  All  other review of systems negative except as mentioned in the HPI.  Physical Exam: Vital signs BP 122/79   Pulse 90   Temp 98.9 F (37.2 C) (Temporal)   Ht '5\' 9"'$  (1.753 m)   Wt (!) 331 lb (150.1 kg)   SpO2 97%   BMI 48.88 kg/m   General:   Alert,  Well-developed, well-nourished, pleasant and cooperative in NAD Airway:  Mallampati 2 Lungs:  Clear throughout to auscultation.   Heart:  Regular rate and rhythm; no murmurs, clicks, rubs,  or gallops. Abdomen:  Soft, nontender and nondistended. Normal bowel sounds.   Neuro/Psych:  Normal mood and affect. A and O x 3   Yifan Auker E. Candis Schatz, MD [facility]

## 2021-12-22 NOTE — Op Note (Signed)
[place] Patient Name: Benjamin Mann Procedure Date: 12/22/2021 3:41 PM MRN: [id number] Endoscopist: Nicki Reaper E. Candis Schatz , MD, [id number] Age: 46 Referring MD:  Date of Birth: 1975/07/01 Gender: Male Account #: [id number] Procedure:                Colonoscopy Indications:              Screening for colorectal malignant neoplasm, This                            is the patient's first colonoscopy Medicines:                Monitored Anesthesia Care Procedure:                Pre-Anesthesia Assessment:                           - Prior to the procedure, a History and Physical                            was performed, and patient medications and                            allergies were reviewed. The patient's tolerance of                            previous anesthesia was also reviewed. The risks                            and benefits of the procedure and the sedation                            options and risks were discussed with the patient.                            All questions were answered, and informed consent                            was obtained. Prior Anticoagulants: The patient has                            taken no anticoagulant or antiplatelet agents. ASA                            Grade Assessment: II - A patient with mild systemic                            disease. After reviewing the risks and benefits,                            the patient was deemed in satisfactory condition to                            undergo the procedure.  After obtaining informed consent, the colonoscope                            was passed under direct vision. Throughout the                            procedure, the patient's blood pressure, pulse, and                            oxygen saturations were monitored continuously. The                            Colonoscope was introduced through the anus and                            advanced to the  the terminal ileum, with                            identification of the appendiceal orifice and IC                            valve. The colonoscopy was performed without                            difficulty. The patient tolerated the procedure                            well. The quality of the bowel preparation was                            adequate. The terminal ileum, ileocecal valve,                            appendiceal orifice, and rectum were photographed.                            The bowel preparation used was SUPREP via split                            dose instruction. Scope In: 3:52:47 PM Scope Out: 4:13:18 PM Scope Withdrawal Time: 0 hours 12 minutes 21 seconds  Total Procedure Duration: 0 hours 20 minutes 31 seconds  Findings:                 The perianal and digital rectal examinations were                            normal. Pertinent negatives include normal                            sphincter tone and no palpable rectal lesions.                           A 3 mm polyp was found in the hepatic flexure. The  polyp was sessile. The polyp was removed with a                            cold snare. Resection and retrieval were complete.                            Estimated blood loss was minimal.                           Many medium-mouthed and small-mouthed diverticula                            were found in the sigmoid colon and descending                            colon. There was narrowing of the colon in                            association with the diverticular opening. There                            was evidence of an impacted diverticulum.                           The exam was otherwise normal throughout the                            examined colon.                           The terminal ileum appeared normal.                           The retroflexed view of the distal rectum and anal                            verge was normal  and showed no anal or rectal                            abnormalities. Complications:            No immediate complications. Estimated Blood Loss:     Estimated blood loss was minimal. Impression:               - One 3 mm polyp at the hepatic flexure, removed                            with a cold snare. Resected and retrieved.                           - Moderate diverticulosis in the sigmoid colon and                            in the descending colon. There was narrowing of the  colon in association with the diverticular opening.                            There was evidence of an impacted diverticulum.                           - The examined portion of the ileum was normal.                           - The distal rectum and anal verge are normal on                            retroflexion view. Recommendation:           - Patient has a contact number available for                            emergencies. The signs and symptoms of potential                            delayed complications were discussed with the                            patient. Return to normal activities tomorrow.                            Written discharge instructions were provided to the                            patient.                           - Resume previous diet.                           - Continue present medications.                           - Await pathology results.                           - Repeat colonoscopy (date not yet determined) for                            surveillance based on pathology results.                           - Recommend high fiber diet or daily fiber                            supplement to reduce risk of diverticular                            complications. Caitrin Pendergraph E. Candis Schatz, MD 12/22/2021 4:20:00 PM This report has been signed electronically.

## 2021-12-22 NOTE — Progress Notes (Signed)
Called to room to assist during endoscopic procedure.  Patient ID and intended procedure confirmed with present staff. Received instructions for my participation in the procedure from the performing physician.  

## 2021-12-22 NOTE — Progress Notes (Signed)
VS completed by DT.  Pt's states no medical or surgical changes since previsit or office visit.  

## 2021-12-22 NOTE — Patient Instructions (Signed)
- Patient has a contact number available for emergencies. The signs and symptoms of potential delayed complications were discussed with the patient. Return to normal activities tomorrow. Written discharge instructions were provided to the patient. - Resume previous diet. - Continue present medications. - Await pathology results. - Repeat colonoscopy (date not yet determined) for surveillance based on pathology results. - Recommend high fiber diet or daily fiber supplement to reduce risk of diverticular complications.  YOU HAD AN ENDOSCOPIC PROCEDURE TODAY AT [facility]:   Refer to the procedure report that was given to you for any specific questions about what was found during the examination.  If the procedure report does not answer your questions, please call your gastroenterologist to clarify.  If you requested that your care partner not be given the details of your procedure findings, then the procedure report has been included in a sealed envelope for you to review at your convenience later.  YOU SHOULD EXPECT: Some feelings of bloating in the abdomen. Passage of more gas than usual.  Walking can help get rid of the air that was put into your GI tract during the procedure and reduce the bloating. If you had a lower endoscopy (such as a colonoscopy or flexible sigmoidoscopy) you may notice spotting of blood in your stool or on the toilet paper. If you underwent a bowel prep for your procedure, you may not have a normal bowel movement for a few days.  Please Note:  You might notice some irritation and congestion in your nose or some drainage.  This is from the oxygen used during your procedure.  There is no need for concern and it should clear up in a day or so.  SYMPTOMS TO REPORT IMMEDIATELY:  Following lower endoscopy (colonoscopy or flexible sigmoidoscopy):  Excessive amounts of blood in the stool  Significant tenderness or worsening of abdominal pains  Swelling of the  abdomen that is new, acute  Fever of 100F or higher  For urgent or emergent issues, a gastroenterologist can be reached at any hour by calling [phone]. Do not use MyChart messaging for urgent concerns.    DIET:  We do recommend a small meal at first, but then you may proceed to your regular diet.  Drink plenty of fluids but you should avoid alcoholic beverages for 24 hours.  ACTIVITY:  You should plan to take it easy for the rest of today and you should NOT DRIVE or use heavy machinery until tomorrow (because of the sedation medicines used during the test).    FOLLOW UP: Our staff will call the number listed on your records the next business day following your procedure.  We will call around 7:15- 8:00 am to check on you and address any questions or concerns that you may have regarding the information given to you following your procedure. If we do not reach you, we will leave a message.     If any biopsies were taken you will be contacted by phone or by letter within the next 1-3 weeks.  Please call us at [phone] if you have not heard about the biopsies in 3 weeks.    SIGNATURES/CONFIDENTIALITY: You and/or your care partner have signed paperwork which will be entered into your electronic medical record.  These signatures attest to the fact that that the information above on your After Visit Summary has been reviewed and is understood.  Full responsibility of the confidentiality of this discharge information lies with you and/or your  care-partner.  

## 2021-12-23 ENCOUNTER — Telehealth: Payer: Self-pay

## 2021-12-23 NOTE — Telephone Encounter (Signed)
  Follow up Call-     12/22/2021    3:24 PM  Call back number  Post procedure Call Back phone  # [phone]  Permission to leave phone message Yes    Post op call attempted, no answer, full mailbox.

## 2021-12-29 NOTE — Progress Notes (Signed)
Benjamin Mann,  Kermit Balo news: the polyp (or polyps) that I removed during your recent examination were NOT precancerous.  You should continue to follow current colorectal cancer screening guidelines with a repeat colonoscopy in 10 years.    If you develop any new rectal bleeding, abdominal pain or significant bowel habit changes, please contact me before then.

## 2022-01-10 ENCOUNTER — Encounter: Payer: Self-pay | Admitting: Nurse Practitioner

## 2023-02-17 IMAGING — US US ABDOMEN LIMITED
1 series · 14 of 25 positions shown · non-contrast
Comparison: None.

CLINICAL DATA: Several episodes of right upper quadrant pain with
bilious vomiting in the past year.

EXAM:
ULTRASOUND ABDOMEN LIMITED RIGHT UPPER QUADRANT

[Series 1: us abdomen limited · 0.23mm/px · 14 of 80 slices shown]
[im 1/80]
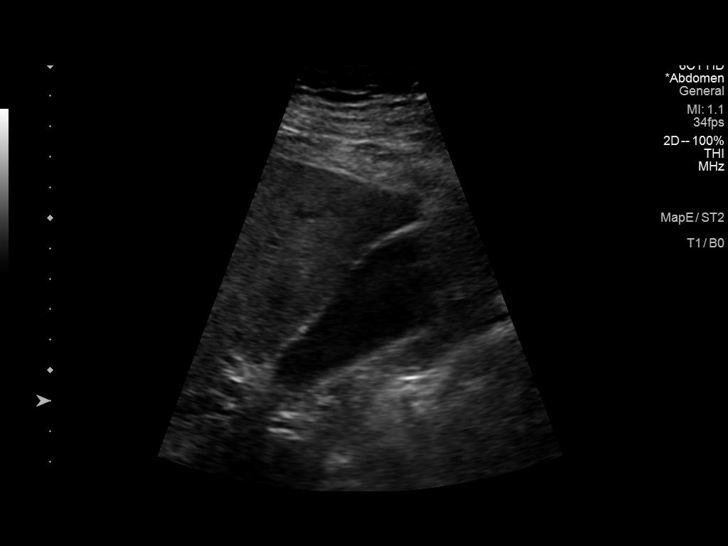
[im 7/80]
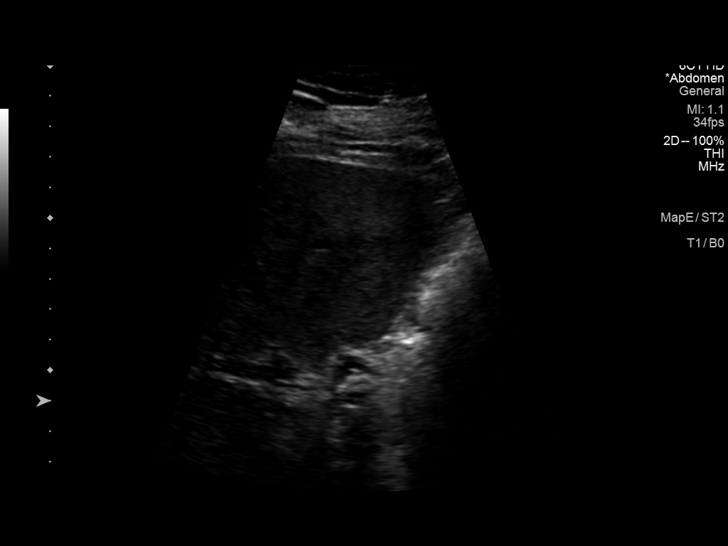
[im 14/80]
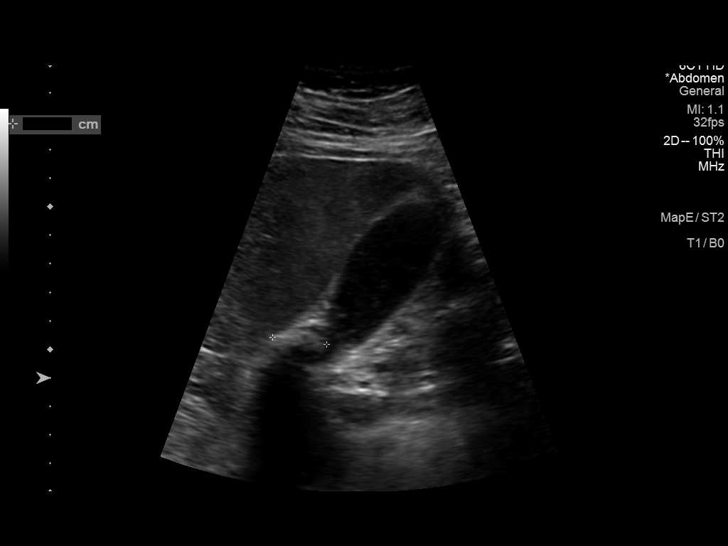
[im 20/80]
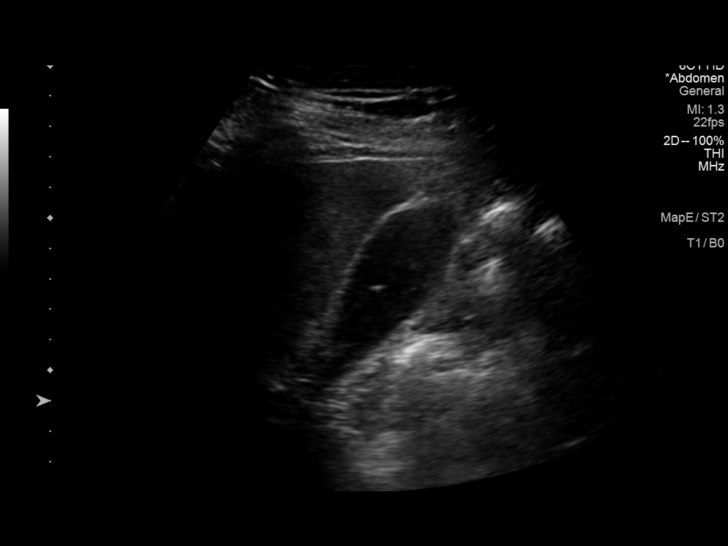
[im 27/80]
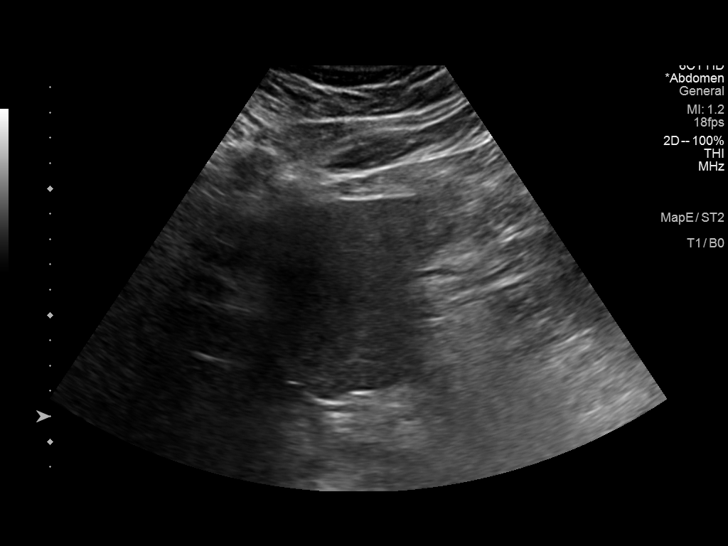
[im 30/80]
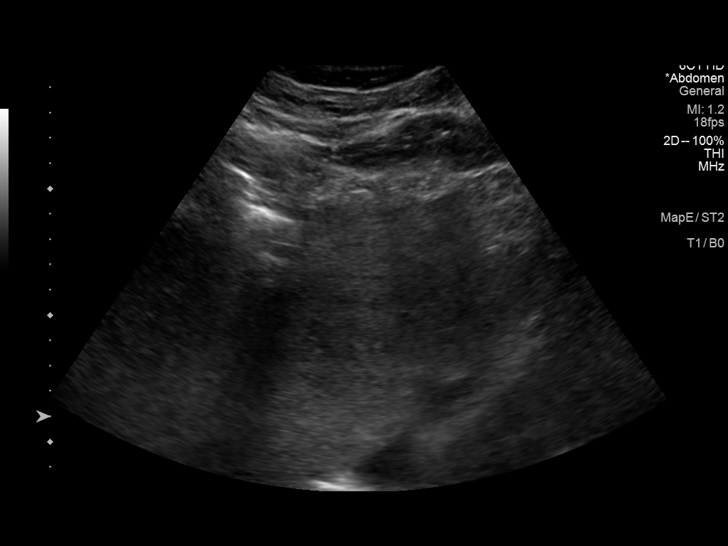
[im 37/80]
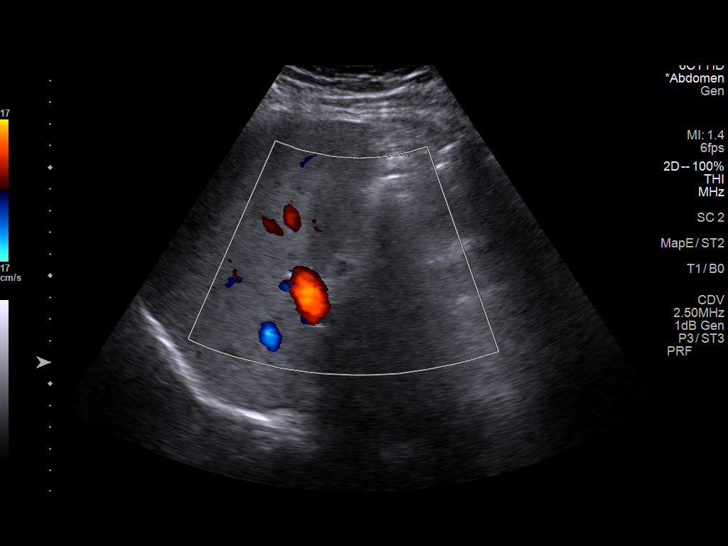
[im 43/80]
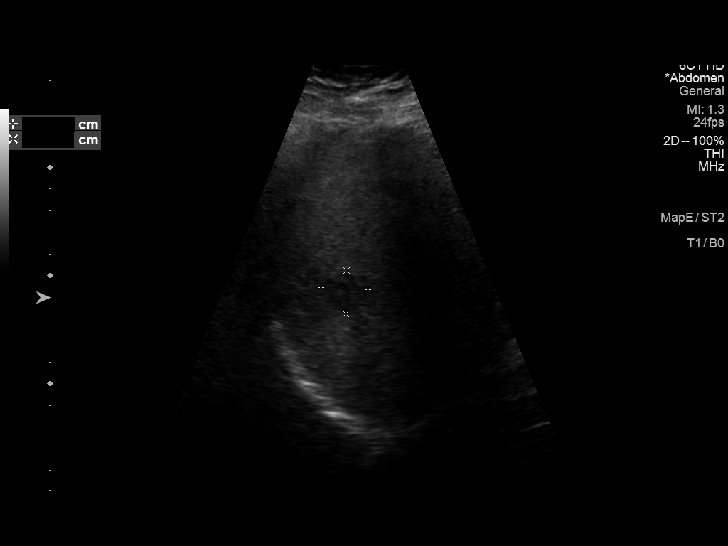
[im 50/80]
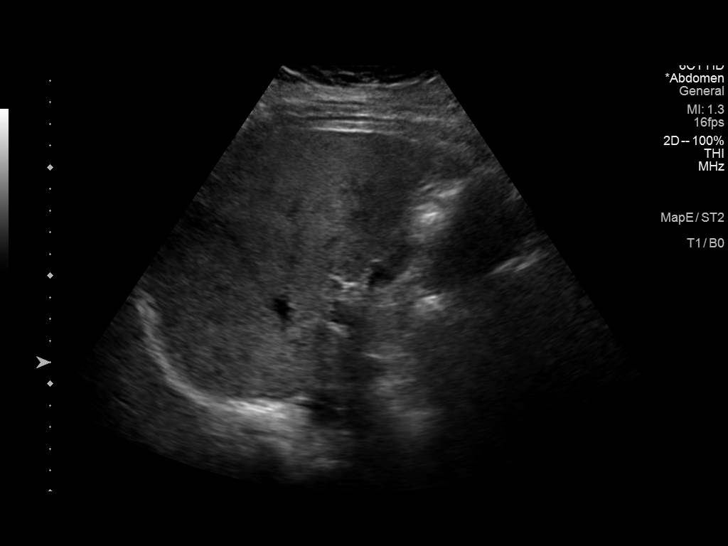
[im 53/80]
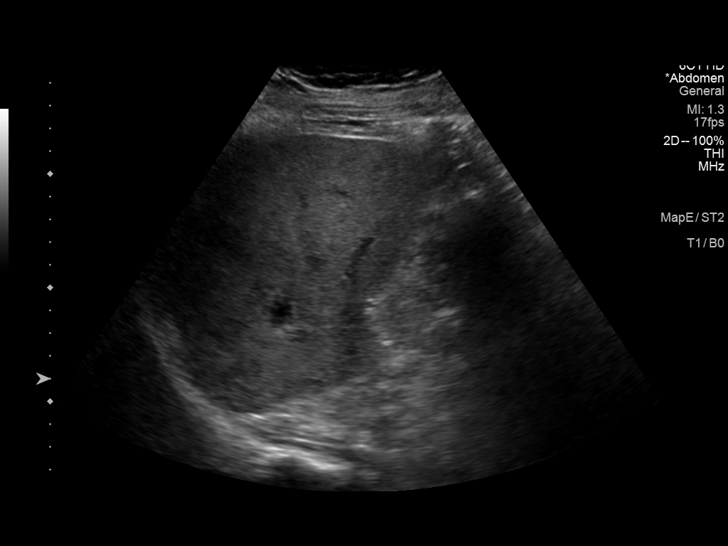
[im 60/80]
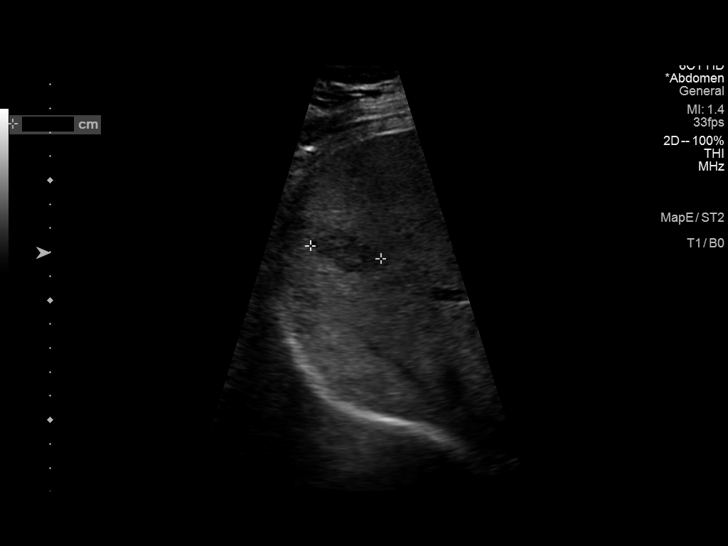
[im 66/80]
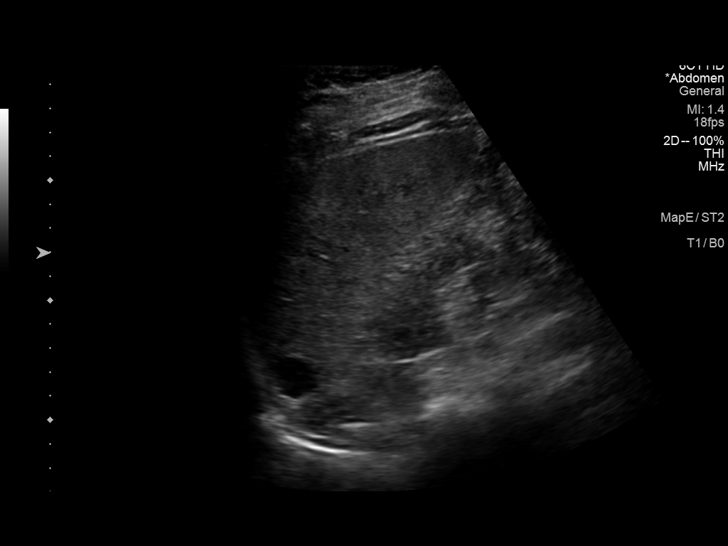
[im 73/80]
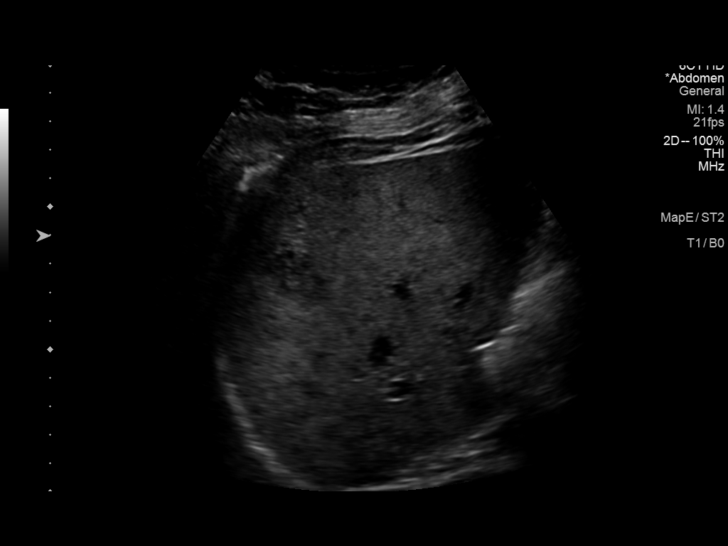
[im 80/80]
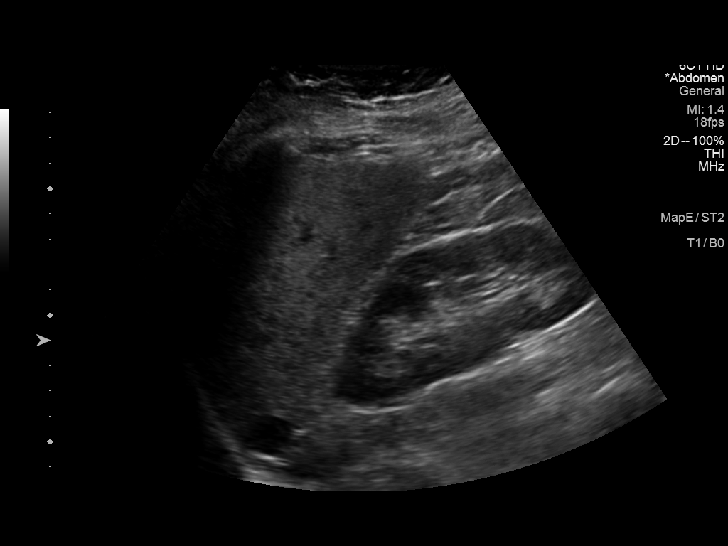

[14 of 25 positions shown; findings below may reference images not displayed]

FINDINGS: Gallbladder:

Cholelithiasis. No gallbladder wall thickening or pericholecystic
fluid. Sonographic Murphy sign is negative per technologist.

Common bile duct:

Diameter: 3 mm

Liver:

Diffuse increased echogenicity. 3.0 cm solid hypoechoic mass noted
in the right liver lobe. Additional hypoechoic lesion in the
posterior right hepatic lobe measuring 2.1 cm is consistent with a
cyst. Portal vein is patent on color Doppler imaging with normal
direction of blood flow towards the liver.

Other: None.
IMPRESSION: 1. 3.0 cm solid hypoechoic right liver mass or course further
evaluation with contrast enhanced CT or MRI.
2. Mild cholelithiasis

These results will be called to the ordering clinician or
representative by the Radiologist Assistant, and communication
documented in the PACS or [REDACTED].

## 2023-12-13 ENCOUNTER — Other Ambulatory Visit: Payer: Self-pay

## 2023-12-13 ENCOUNTER — Other Ambulatory Visit: Payer: Self-pay | Admitting: Adult Health

## 2023-12-13 MED ORDER — OSELTAMIVIR PHOSPHATE 75 MG PO CAPS
75.0000 mg | ORAL_CAPSULE | Freq: Every day | ORAL | 0 refills | Status: AC
  Filled 2023-12-13: qty 10, 10d supply, fill #0

## 2024-04-11 ENCOUNTER — Ambulatory Visit: Admitting: Family Medicine
